# Patient Record
Sex: Male | Born: 1980 | Hispanic: Yes | Marital: Married | Smoking: Current every day smoker
Health system: Southern US, Community
[De-identification: ages and names within clinical notes are randomized; demographics above are authoritative.]

## PROBLEM LIST (undated history)

## (undated) DIAGNOSIS — Z720 Tobacco use: Secondary | ICD-10-CM

## (undated) DIAGNOSIS — R9431 Abnormal electrocardiogram [ECG] [EKG]: Secondary | ICD-10-CM

## (undated) DIAGNOSIS — K8020 Calculus of gallbladder without cholecystitis without obstruction: Secondary | ICD-10-CM

---

## 2014-08-04 ENCOUNTER — Encounter (HOSPITAL_COMMUNITY): Admission: RE | Payer: Self-pay | Source: Ambulatory Visit

## 2014-08-04 ENCOUNTER — Encounter (HOSPITAL_COMMUNITY)
Admission: EM | Disposition: A | Discharge status: Home / Self Care | Payer: Self-pay | Source: Home / Self Care | Attending: Cardiovascular Disease

## 2014-08-04 ENCOUNTER — Other Ambulatory Visit: Payer: Self-pay

## 2014-08-04 ENCOUNTER — Encounter (HOSPITAL_COMMUNITY): Payer: Self-pay | Admitting: *Deleted

## 2014-08-04 ENCOUNTER — Inpatient Hospital Stay (HOSPITAL_COMMUNITY)
Admission: EM | Admit: 2014-08-04 | Discharge: 2014-08-04 | DRG: 446 | Disposition: A | Discharge status: Home / Self Care | Payer: Self-pay | Attending: Cardiovascular Disease | Admitting: Cardiovascular Disease

## 2014-08-04 ENCOUNTER — Inpatient Hospital Stay (HOSPITAL_COMMUNITY): Payer: Self-pay

## 2014-08-04 ENCOUNTER — Emergency Department (HOSPITAL_COMMUNITY): Payer: Self-pay

## 2014-08-04 ENCOUNTER — Ambulatory Visit (HOSPITAL_COMMUNITY): Admission: AD | Admit: 2014-08-04 | Payer: MEDICAID | Admitting: Cardiovascular Disease

## 2014-08-04 DIAGNOSIS — E669 Obesity, unspecified: Secondary | ICD-10-CM | POA: Diagnosis present

## 2014-08-04 DIAGNOSIS — R079 Chest pain, unspecified: Secondary | ICD-10-CM

## 2014-08-04 DIAGNOSIS — K760 Fatty (change of) liver, not elsewhere classified: Secondary | ICD-10-CM | POA: Diagnosis present

## 2014-08-04 DIAGNOSIS — Z720 Tobacco use: Secondary | ICD-10-CM

## 2014-08-04 DIAGNOSIS — R109 Unspecified abdominal pain: Secondary | ICD-10-CM

## 2014-08-04 DIAGNOSIS — F17200 Nicotine dependence, unspecified, uncomplicated: Secondary | ICD-10-CM | POA: Diagnosis present

## 2014-08-04 DIAGNOSIS — Z6832 Body mass index (BMI) 32.0-32.9, adult: Secondary | ICD-10-CM

## 2014-08-04 DIAGNOSIS — F17210 Nicotine dependence, cigarettes, uncomplicated: Secondary | ICD-10-CM | POA: Diagnosis present

## 2014-08-04 DIAGNOSIS — I213 ST elevation (STEMI) myocardial infarction of unspecified site: Secondary | ICD-10-CM

## 2014-08-04 DIAGNOSIS — D72829 Elevated white blood cell count, unspecified: Secondary | ICD-10-CM | POA: Diagnosis present

## 2014-08-04 DIAGNOSIS — R9431 Abnormal electrocardiogram [ECG] [EKG]: Secondary | ICD-10-CM

## 2014-08-04 DIAGNOSIS — R1013 Epigastric pain: Secondary | ICD-10-CM

## 2014-08-04 DIAGNOSIS — K8020 Calculus of gallbladder without cholecystitis without obstruction: Principal | ICD-10-CM

## 2014-08-04 DIAGNOSIS — E785 Hyperlipidemia, unspecified: Secondary | ICD-10-CM | POA: Diagnosis present

## 2014-08-04 HISTORY — DX: Abnormal electrocardiogram (ECG) (EKG): R94.31

## 2014-08-04 HISTORY — PX: CARDIAC CATHETERIZATION: SHX172

## 2014-08-04 HISTORY — DX: Morbid (severe) obesity due to excess calories: E66.01

## 2014-08-04 HISTORY — PX: LEFT HEART CATHETERIZATION WITH CORONARY ANGIOGRAM: SHX5451

## 2014-08-04 HISTORY — DX: Calculus of gallbladder without cholecystitis without obstruction: K80.20

## 2014-08-04 HISTORY — DX: Tobacco use: Z72.0

## 2014-08-04 LAB — CBC WITH DIFFERENTIAL/PLATELET
Basophils Absolute: 0 10*3/uL (ref 0.0–0.1)
Basophils Absolute: 0 10*3/uL (ref 0.0–0.1)
Basophils Relative: 0 % (ref 0–1)
Basophils Relative: 0 % (ref 0–1)
EOS PCT: 1 % (ref 0–5)
EOS PCT: 0 % (ref 0–5)
Eosinophils Absolute: 0.2 10*3/uL (ref 0.0–0.7)
Eosinophils Absolute: 0 10*3/uL (ref 0.0–0.7)
HCT: 48 % (ref 39.0–52.0)
HCT: 46.3 % (ref 39.0–52.0)
Hemoglobin: 17.1 g/dL — ABNORMAL HIGH (ref 13.0–17.0)
Hemoglobin: 16.7 g/dL (ref 13.0–17.0)
LYMPHS ABS: 1.2 10*3/uL (ref 0.7–4.0)
LYMPHS PCT: 7 % — AB (ref 12–46)
Lymphocytes Relative: 23 % (ref 12–46)
Lymphs Abs: 2.8 10*3/uL (ref 0.7–4.0)
MCH: 30.6 pg (ref 26.0–34.0)
MCH: 31.2 pg (ref 26.0–34.0)
MCHC: 35.6 g/dL (ref 30.0–36.0)
MCHC: 36.1 g/dL — ABNORMAL HIGH (ref 30.0–36.0)
MCV: 86 fL (ref 78.0–100.0)
MCV: 86.4 fL (ref 78.0–100.0)
MONOS PCT: 5 % (ref 3–12)
Monocytes Absolute: 0.6 10*3/uL (ref 0.1–1.0)
Monocytes Absolute: 0.6 10*3/uL (ref 0.1–1.0)
Monocytes Relative: 4 % (ref 3–12)
NEUTROS PCT: 89 % — AB (ref 43–77)
Neutro Abs: 8.7 10*3/uL — ABNORMAL HIGH (ref 1.7–7.7)
Neutro Abs: 15.8 10*3/uL — ABNORMAL HIGH (ref 1.7–7.7)
Neutrophils Relative %: 71 % (ref 43–77)
PLATELETS: 225 10*3/uL (ref 150–400)
PLATELETS: 188 10*3/uL (ref 150–400)
RBC: 5.58 MIL/uL (ref 4.22–5.81)
RBC: 5.36 MIL/uL (ref 4.22–5.81)
RDW: 12.6 % (ref 11.5–15.5)
RDW: 12.6 % (ref 11.5–15.5)
WBC: 12.3 10*3/uL — ABNORMAL HIGH (ref 4.0–10.5)
WBC: 17.6 10*3/uL — AB (ref 4.0–10.5)

## 2014-08-04 LAB — RAPID URINE DRUG SCREEN, HOSP PERFORMED
Amphetamines: NOT DETECTED
BARBITURATES: NOT DETECTED
BENZODIAZEPINES: POSITIVE — AB
Cocaine: NOT DETECTED
Opiates: POSITIVE — AB
Tetrahydrocannabinol: NOT DETECTED

## 2014-08-04 LAB — HEPATIC FUNCTION PANEL
ALK PHOS: 81 U/L (ref 38–126)
ALT: 34 U/L (ref 17–63)
AST: 21 U/L (ref 15–41)
Albumin: 4.3 g/dL (ref 3.5–5.0)
BILIRUBIN DIRECT: 0.1 mg/dL (ref 0.1–0.5)
BILIRUBIN INDIRECT: 0.4 mg/dL (ref 0.3–0.9)
Total Bilirubin: 0.5 mg/dL (ref 0.3–1.2)
Total Protein: 7.2 g/dL (ref 6.5–8.1)

## 2014-08-04 LAB — COMPREHENSIVE METABOLIC PANEL
ALBUMIN: 4.1 g/dL (ref 3.5–5.0)
ALT: 31 U/L (ref 17–63)
ANION GAP: 12 (ref 5–15)
AST: 24 U/L (ref 15–41)
Alkaline Phosphatase: 71 U/L (ref 38–126)
BILIRUBIN TOTAL: 0.7 mg/dL (ref 0.3–1.2)
BUN: 13 mg/dL (ref 6–20)
CALCIUM: 9 mg/dL (ref 8.9–10.3)
CO2: 23 mmol/L (ref 22–32)
CREATININE: 0.79 mg/dL (ref 0.61–1.24)
Chloride: 104 mmol/L (ref 101–111)
GFR calc Af Amer: >60 mL/min (ref >60)
GFR calc non Af Amer: >60 mL/min (ref >60)
GLUCOSE: 120 mg/dL — AB (ref 70–99)
Potassium: 3.5 mmol/L (ref 3.5–5.1)
Sodium: 139 mmol/L (ref 135–145)
Total Protein: 7 g/dL (ref 6.5–8.1)

## 2014-08-04 LAB — MRSA PCR SCREENING: MRSA by PCR: POSITIVE — AB

## 2014-08-04 LAB — LIPID PANEL
CHOLESTEROL: 201 mg/dL — AB (ref 0–200)
HDL: 40 mg/dL — ABNORMAL LOW (ref >40)
LDL Cholesterol: 147 mg/dL — ABNORMAL HIGH (ref 0–99)
TRIGLYCERIDES: 69 mg/dL (ref <150)
Total CHOL/HDL Ratio: 5 RATIO
VLDL: 14 mg/dL (ref 0–40)

## 2014-08-04 LAB — LIPASE, BLOOD: Lipase: 40 U/L (ref 22–51)

## 2014-08-04 LAB — TSH: TSH: 0.929 u[IU]/mL (ref 0.350–4.500)

## 2014-08-04 LAB — MAGNESIUM: MAGNESIUM: 1.9 mg/dL (ref 1.7–2.4)

## 2014-08-04 LAB — I-STAT TROPONIN, ED: Troponin i, poc: 0 ng/mL (ref 0.00–0.08)

## 2014-08-04 LAB — PROTIME-INR
INR: 0.96 (ref 0.00–1.49)
PROTHROMBIN TIME: 12.9 s (ref 11.6–15.2)

## 2014-08-04 LAB — TROPONIN I
Troponin I: <0.03 ng/mL (ref <0.031)
Troponin I: <0.03 ng/mL (ref <0.031)

## 2014-08-04 LAB — D-DIMER, QUANTITATIVE: D-Dimer, Quant: <0.27 ug/mL-FEU (ref 0.00–0.48)

## 2014-08-04 LAB — AMYLASE: Amylase: 40 U/L (ref 28–100)

## 2014-08-04 LAB — APTT: APTT: 25 s (ref 24–37)

## 2014-08-04 SURGERY — LEFT HEART CATH AND CORONARY ANGIOGRAPHY
Anesthesia: LOCAL

## 2014-08-04 SURGERY — LEFT HEART CATHETERIZATION WITH CORONARY ANGIOGRAM
Anesthesia: LOCAL

## 2014-08-04 MED ORDER — HEPARIN SODIUM (PORCINE) 5000 UNIT/ML IJ SOLN
4000.0000 [IU] | Freq: Once | INTRAMUSCULAR | Status: AC
  Administered 2014-08-04: 4000 [IU] via INTRAVENOUS

## 2014-08-04 MED ORDER — FENTANYL CITRATE (PF) 100 MCG/2ML IJ SOLN
INTRAMUSCULAR | Status: AC
  Filled 2014-08-04: qty 2

## 2014-08-04 MED ORDER — SODIUM CHLORIDE 0.9 % IV SOLN: INTRAVENOUS | Status: AC

## 2014-08-04 MED ORDER — HEPARIN SODIUM (PORCINE) 5000 UNIT/ML IJ SOLN: 5000.0000 [IU] | Freq: Once | INTRAMUSCULAR | Status: DC

## 2014-08-04 MED ORDER — MORPHINE SULFATE 4 MG/ML IJ SOLN
INTRAMUSCULAR | Status: AC
Start: 2014-08-04 — End: 2014-08-04
  Filled 2014-08-04: qty 1

## 2014-08-04 MED ORDER — MUPIROCIN 2 % EX OINT
1.0000 "application " | TOPICAL_OINTMENT | Freq: Two times a day (BID) | CUTANEOUS | Status: DC
  Administered 2014-08-04: 1 via NASAL
  Filled 2014-08-04: qty 22

## 2014-08-04 MED ORDER — ASPIRIN 81 MG PO CHEW
324.0000 mg | CHEWABLE_TABLET | Freq: Once | ORAL | Status: AC
  Administered 2014-08-04: 324 mg via ORAL

## 2014-08-04 MED ORDER — HEPARIN SODIUM (PORCINE) 1000 UNIT/ML IJ SOLN
INTRAMUSCULAR | Status: AC
  Filled 2014-08-04: qty 1

## 2014-08-04 MED ORDER — LIDOCAINE HCL (PF) 1 % IJ SOLN
INTRAMUSCULAR | Status: AC
  Filled 2014-08-04: qty 30

## 2014-08-04 MED ORDER — ACETAMINOPHEN 325 MG PO TABS: 650.0000 mg | ORAL_TABLET | ORAL | Status: DC | PRN

## 2014-08-04 MED ORDER — HEPARIN (PORCINE) IN NACL 2-0.9 UNIT/ML-% IJ SOLN
INTRAMUSCULAR | Status: AC
  Filled 2014-08-04: qty 1000

## 2014-08-04 MED ORDER — MORPHINE SULFATE 4 MG/ML IJ SOLN
4.0000 mg | Freq: Once | INTRAMUSCULAR | Status: AC
  Administered 2014-08-04: 4 mg via INTRAVENOUS

## 2014-08-04 MED ORDER — ACETAMINOPHEN ER 650 MG PO TBCR: 650.0000 mg | EXTENDED_RELEASE_TABLET | Freq: Three times a day (TID) | ORAL | Status: AC | PRN

## 2014-08-04 MED ORDER — ASPIRIN 81 MG PO CHEW: 324.0000 mg | CHEWABLE_TABLET | ORAL | Status: DC

## 2014-08-04 MED ORDER — SODIUM CHLORIDE 0.9 % IJ SOLN: 3.0000 mL | INTRAMUSCULAR | Status: DC | PRN

## 2014-08-04 MED ORDER — MIDAZOLAM HCL 2 MG/2ML IJ SOLN
INTRAMUSCULAR | Status: AC
  Filled 2014-08-04: qty 2

## 2014-08-04 MED ORDER — ONDANSETRON HCL 4 MG/2ML IJ SOLN: 4.0000 mg | Freq: Four times a day (QID) | INTRAMUSCULAR | Status: DC | PRN

## 2014-08-04 MED ORDER — VERAPAMIL HCL 2.5 MG/ML IV SOLN
INTRAVENOUS | Status: AC
  Filled 2014-08-04: qty 2

## 2014-08-04 MED ORDER — CHLORHEXIDINE GLUCONATE CLOTH 2 % EX PADS
6.0000 | MEDICATED_PAD | Freq: Every day | CUTANEOUS | Status: DC
  Administered 2014-08-04: 6 via TOPICAL

## 2014-08-04 MED ORDER — ASPIRIN 300 MG RE SUPP: 300.0000 mg | RECTAL | Status: DC

## 2014-08-04 MED ORDER — SODIUM CHLORIDE 0.9 % IV SOLN: 250.0000 mL | INTRAVENOUS | Status: DC | PRN

## 2014-08-04 MED ORDER — HEPARIN SODIUM (PORCINE) 5000 UNIT/ML IJ SOLN
INTRAMUSCULAR | Status: AC
  Filled 2014-08-04: qty 1

## 2014-08-04 MED ORDER — CETYLPYRIDINIUM CHLORIDE 0.05 % MT LIQD
7.0000 mL | Freq: Two times a day (BID) | OROMUCOSAL | Status: DC
  Administered 2014-08-04: 7 mL via OROMUCOSAL

## 2014-08-04 MED ORDER — ONDANSETRON HCL 4 MG/2ML IJ SOLN
4.0000 mg | Freq: Four times a day (QID) | INTRAMUSCULAR | Status: DC | PRN
Start: 2014-08-04 — End: 2014-08-04

## 2014-08-04 MED ORDER — ASPIRIN 81 MG PO CHEW
81.0000 mg | CHEWABLE_TABLET | Freq: Once | ORAL | Status: DC
  Filled 2014-08-04: qty 1

## 2014-08-04 MED ORDER — NITROGLYCERIN 1 MG/10 ML FOR IR/CATH LAB
INTRA_ARTERIAL | Status: AC
  Filled 2014-08-04: qty 10

## 2014-08-04 MED ORDER — MORPHINE SULFATE 2 MG/ML IJ SOLN
2.0000 mg | INTRAMUSCULAR | Status: DC | PRN
  Administered 2014-08-04: 2 mg via INTRAVENOUS
  Filled 2014-08-04: qty 1

## 2014-08-04 MED ORDER — NITROGLYCERIN 0.4 MG SL SUBL: 0.4000 mg | SUBLINGUAL_TABLET | SUBLINGUAL | Status: DC | PRN

## 2014-08-04 MED ORDER — SODIUM CHLORIDE 0.9 % IJ SOLN
3.0000 mL | Freq: Two times a day (BID) | INTRAMUSCULAR | Status: DC
  Administered 2014-08-04: 3 mL via INTRAVENOUS

## 2014-08-04 SURGICAL SUPPLY — 16 items
CATH HEARTRAIL 6F IL3.5 (CATHETERS) ×2 IMPLANT
CATH INFINITI 5FR ANG PIGTAIL (CATHETERS) ×2 IMPLANT
CATH INFINITI 5FR MULTPACK ANG (CATHETERS) IMPLANT
CATH INFINITI JR4 5F (CATHETERS) ×2 IMPLANT
CATH OPTITORQUE JACKY 4.0 5F (CATHETERS) IMPLANT
DEVICE RAD COMP TR BAND LRG (VASCULAR PRODUCTS) ×2 IMPLANT
GLIDESHEATH SLEND SS 6F .021 (SHEATH) ×2 IMPLANT
KIT ENCORE 26 ADVANTAGE (KITS) ×2 IMPLANT
KIT HEART LEFT (KITS) ×2 IMPLANT
PACK CARDIAC CATHETERIZATION (CUSTOM PROCEDURE TRAY) ×2 IMPLANT
SHEATH PINNACLE 5F 10CM (SHEATH) IMPLANT
SYR MEDRAD MARK V 150ML (SYRINGE) ×2 IMPLANT
TRANSDUCER W/STOPCOCK (MISCELLANEOUS) ×2 IMPLANT
TUBING CIL FLEX 10 FLL-RA (TUBING) ×2 IMPLANT
WIRE EMERALD 3MM-J .035X150CM (WIRE) IMPLANT
WIRE SAFE-T 1.5MM-J .035X260CM (WIRE) ×2 IMPLANT

## 2014-08-04 NOTE — ED Notes (Signed)
The pt is c/o epigastric pain since 2300 no nor v.  He last ate at 1900.  He has a history of the same type pain.  He  Is having  Difficulty sitting still from the pain.  Hx of th same type pain

## 2014-08-04 NOTE — Progress Notes (Signed)
[facility] progress note. Chief complaint. Consult request. I received a call from Dr. Carolanne Grumblingracy Turner cardiology requesting a medical consult on this 34 year old gentleman who presented with complaints of chest pain. EKG suggested acute inferior STEMI and the patient was taken to cardiac cath. Final conclusion of cardiac cath normal coronary arteries, normal left ventricular systolic function and no evidence of aortic dissection. Cardiology's impression is of unrelieved epigastric pain most likely secondary to GI origin and not cardiac in origin. Consult requested to further evaluate and treat the source of pain.

## 2014-08-04 NOTE — ED Provider Notes (Signed)
CSN: [id number]     Arrival date & time 08/04/14  0236 History  This chart was scribed for Marisa Severin, MD by Abel Presto, ED Scribe. This patient was seen in room D34C/D34C and the patient's care was started at 2:48 AM.    Chief Complaint  Patient presents with  . Abdominal Pain     Patient is a 34 y.o. male presenting with abdominal pain. The history is provided by the patient. No language interpreter was used.  Abdominal Pain Associated symptoms: no nausea and no vomiting    HPI Comments: John Vasquez is a 34 y.o. male who presents to the Emergency Department complaining of epigastric pain with onset at 11 PM.  Noted diaphoresis. Pt reports similar pain 2 weeks ago lasting 2 hours. Pty notes pain radiates to lower back pain. Pt took Tylenol at 2 AM. Pt last ate around 7 PM. Pt denies significant PMHx or FHx. Pt denies nausea and vomiting.   History reviewed. No pertinent past medical history. History reviewed. No pertinent past surgical history. No family history on file. History  Substance Use Topics  . Smoking status: Never Smoker   . Smokeless tobacco: Not on file  . Alcohol Use: Yes    Review of Systems  Constitutional: Positive for diaphoresis.  Gastrointestinal: Positive for abdominal pain. Negative for nausea and vomiting.  Musculoskeletal: Positive for back pain.     Allergies  Review of patient's allergies indicates no known allergies.  Home Medications   Prior to Admission medications   Not on File   BP 119/74 mmHg  Pulse 71  Temp(Src) 98.1 F (36.7 C) (Oral)  Resp 28  Ht  (1.676 m)  Wt 197 lb (89.359 kg)  BMI 31.81 kg/m2  SpO2 100% Physical Exam  Constitutional: He is oriented to person, place, and time. He appears well-developed and well-nourished. He appears distressed.  HENT:  Head: Normocephalic and atraumatic.  Nose: Nose normal.  Mouth/Throat: Oropharynx is clear and moist.  Eyes: Conjunctivae and EOM are normal. Pupils are equal,  round, and reactive to light.  Neck: Normal range of motion. Neck supple. No JVD present. No tracheal deviation present. No thyromegaly present.  Cardiovascular: Normal rate, regular rhythm, normal heart sounds and intact distal pulses.  Exam reveals no gallop and no friction rub.   No murmur heard. Pulmonary/Chest: Effort normal and breath sounds normal. No stridor. No respiratory distress. He has no wheezes. He has no rales. He exhibits no tenderness.  Abdominal: Soft. Bowel sounds are normal. He exhibits no distension and no mass. There is no tenderness. There is no rebound and no guarding.  Musculoskeletal: Normal range of motion. He exhibits no edema or tenderness.  Lymphadenopathy:    He has no cervical adenopathy.  Neurological: He is alert and oriented to person, place, and time. He displays normal reflexes. He exhibits normal muscle tone. Coordination normal.  Skin: Skin is warm. No rash noted. He is diaphoretic. No erythema. No pallor.  Psychiatric: He has a normal mood and affect. His behavior is normal. Judgment and thought content normal.  Nursing note and vitals reviewed.   ED Course  Procedures (including critical care time) DIAGNOSTIC STUDIES: Oxygen Saturation is 99% on room air, normal by my interpretation.    COORDINATION OF CARE: 3:05 AM Discussed treatment plan with patient at beside, the patient agrees with the plan and has no further questions at this time.   Labs Review Labs Reviewed  CBC WITH DIFFERENTIAL/PLATELET - Abnormal; Notable for  the following:    WBC 12.3 (*)    Hemoglobin 17.1 (*)    Neutro Abs 8.7 (*)    All other components within normal limits  MRSA PCR SCREENING  LIPASE, BLOOD  HEPATIC FUNCTION PANEL  D-DIMER, QUANTITATIVE  AMYLASE  COMPREHENSIVE METABOLIC PANEL  MAGNESIUM  TSH  TROPONIN I  TROPONIN I  TROPONIN I  HEMOGLOBIN A1C  CBC WITH DIFFERENTIAL/PLATELET  PROTIME-INR  APTT  OCCULT BLOOD X 1 CARD TO LAB, STOOL  LIPID PANEL   I-STAT TROPOININ, ED  I-STAT TROPOININ, ED  I-STAT TROPOININ, ED    Imaging Review No results found.   Meds ordered this encounter  Medications  . DISCONTD: aspirin chewable tablet 81 mg    Sig:   . DISCONTD: heparin injection 5,000 Units    Sig:   . heparin injection 4,000 Units    Sig:   . morphine 4 MG/ML injection 4 mg    Sig:   . heparin 5000 UNIT/ML injection    Sig:     Dereck LigasSnider, Steven   : cabinet override  . aspirin chewable tablet 324 mg    Sig:   . morphine 4 MG/ML injection    Sig:     Dereck LigasSnider, Steven   : cabinet override    CRITICAL CARE Performed by: Marisa Severinlga Read Bonelli, MD Total critical care time: 30 min Critical care time was exclusive of separately billable procedures and treating other patients. Critical care was necessary to treat or prevent imminent or life-threatening deterioration. Critical care was time spent personally by me on the following activities: development of treatment plan with patient and/or surrogate as well as nursing, discussions with consultants, evaluation of patient's response to treatment, examination of patient, obtaining history from patient or surrogate, ordering and performing treatments and interventions, ordering and review of laboratory studies, ordering and review of radiographic studies, pulse oximetry and re-evaluation of patient's condition.   2:48 AM Pt transported to ED room from triage 2:49 AM Dr. Norlene Campbelltter consulted with Dr. Kirke CorinArida. Patient case explained and discussed. Call ended at 2:51 PM.  2:59 AM Chest XR complete 3:03 AM heparin injection, morphine injection, and 324 mg ASA chewable administered 3:07 AM Dr. Mayford Knifeurner, cardiologist in to see the pt   MDM   Final diagnoses:  Abdominal pain    I personally performed the services described in this documentation, which was scribed in my presence. The recorded information has been reviewed and is accurate.  34 year old male without previous medical issues.  EKG with elevation  in ST segment in 2, 3, aVF.  There are no reciprocal changes.  Case was discussed with interventional cardiology, as well as Dr. Mayford Knifeurner at the bedside.  No risk factors, but concerning EKG.  Pain is epigastric and radiates backwards.   Marisa Severinlga Elyzabeth Goatley, MD 08/04/14 [phone]

## 2014-08-04 NOTE — ED Notes (Signed)
Pt placed on zoll.

## 2014-08-04 NOTE — Discharge Instructions (Signed)
Colelitiasis (Cholelithiasis) La colelitiasis (tambin llamada clculos en la vescula) es una enfermedad en la que se forman piedras en la vescula. La vescula es un rgano que almacena la bilis que se forma en el hgado y que ayuda a Engineer, agriculturaldigerir grasas. Los clculos comienzan como pequeos cristales y lentamente se transforman en piedras. El dolor en la vescula ocurre cuando se producen espasmos y los clculos obstruyen el conducto. El dolor tambin se produce cuando una piedra sale por el conducto.  FACTORES DE RIESGO  Ser mujer.   Tener embarazos mltiples. Algunas veces los mdicos aconsejan extirpar los clculos biliares antes de futuros embarazos.   Ser obeso.  Dietas que incluyan comidas fritas y grasas.   Ser mayor de 760 aos y el aumento de la edad.   El uso prolongado de medicamentos que contengan hormonas femeninas.   Tener diabetes mellitus.   Prdida rpida de peso.   Historia familiar de clculos (herencia).  SNTOMAS  Nuseas.   Vmitos.  Dolor abdominal.   Piel amarilla (ictericia)   Dolor sbito. Puede persistir desde algunos minutos hasta algunas horas.  Grant RutsFiebre.   Sensibilidad al tacto. En algunos casos, cuando los clculos biliares no se mueven hacia el conducto biliar, las personas no sienten dolor ni presentan sntomas. Estos se denominan clculos "silenciosos".  TRATAMIENTO Los clculos silenciosos no requieren TEFL teachertratamiento. En los [place] Tool Workscasos graves, podr ser Bangladeshnecesaria una ciruga de urgencia. Las opciones de tratamiento son:  Kandis BanCiruga para extirpar la vescula. Es el tratamiento ms frecuente.  Medicamentos. No siempre dan resultado y pueden demorar entre 6 y 12 meses o ms en Scientist, water qualityhacer efecto.  Tratamiento con ondas de choque (litotricia biliar extracorporal). En este tratamiento, una mquina de ultrasonido enva ondas de choque a la vescula para destruir los clculos en pequeos fragmentos que luego podrn pasar a los intestinos o ser disueltas  con medicamentos. INSTRUCCIONES PARA EL CUIDADO EN EL HOGAR   Slo tome medicamentos de venta libre o recetados para Primary school teachercalmar el dolor, Environmental health practitionerel malestar o bajar la fiebre, segn las indicaciones de su mdico.   Siga una dieta baja en grasas hasta que su mdico lo vea nuevamente. Las grasas hacen que la vescula se Technical sales engineercontraiga, lo que puede Engineer, agriculturalproducir dolor.   Concurra a las consultas de control con su mdico segn las indicaciones. Los ataques casi siempre son recurrentes y generalmente habr que someterse a una ciruga como [place] Citytratamiento permanente.  SOLICITE ATENCIN MDICA DE INMEDIATO SI:   El dolor aumenta y no puede controlarlo con los medicamentos.   Tiene fiebre o sntomas persistentes durante ms de 2 - 3 das.   Tiene fiebre y los sntomas empeoran repentinamente.   Tiene nuseas o vmitos persistentes.  ASEGRESE DE QUE:   Comprende estas instrucciones.  Controlar su afeccin.  Recibir ayuda de inmediato si no mejora o si empeora. Document Released: 01/06/2006 Document Revised: 11/22/2012 [facility] Patient Information 2015 [place]Care, [place]. This information is not intended to replace advice given to you by your health care provider. Make sure you discuss any questions you have with your health care provider. _____________   Radial Site Care Refer to this sheet in the next few weeks. These instructions provide you with information on caring for yourself after your procedure. Your caregiver may also give you more specific instructions. Your treatment has been planned according to current medical practices, but problems sometimes occur. Call your caregiver if you have any problems or questions after your procedure. HOME CARE INSTRUCTIONS  You may shower the day after  the procedure.Remove the bandage (dressing) and gently wash the site with plain soap and water.Gently pat the site dry.   Do not apply powder or lotion to the site.   Do not submerge the affected site in water for  3 to 5 days.   Inspect the site at least twice daily.   Do not flex or bend the affected arm for 24 hours.   No lifting over 5 pounds (2.3 kg) for 5 days after your procedure.   Do not drive home if you are discharged the same day of the procedure. Have someone else drive you.   You may drive 24 hours after the procedure unless otherwise instructed by your caregiver.  What to expect:  Any bruising will usually fade within 1 to 2 weeks.   Blood that collects in the tissue (hematoma) may be painful to the touch. It should usually decrease in size and tenderness within 1 to 2 weeks.  SEEK IMMEDIATE MEDICAL CARE IF:  You have unusual pain at the radial site.   You have redness, warmth, swelling, or pain at the radial site.   You have drainage (other than a small amount of blood on the dressing).   You have chills.   You have a fever or persistent symptoms for more than 72 hours.   You have a fever and your symptoms suddenly get worse.   Your arm becomes pale, cool, tingly, or numb.   You have heavy bleeding from the site. Hold pressure on the site.

## 2014-08-04 NOTE — ED Notes (Signed)
Pt ready to cath lab. Dr. Mayford Knifeurner at bedside.

## 2014-08-04 NOTE — CV Procedure (Signed)
   Cardiac Catheterization Procedure Note  Name: John Vasquez MRN: [id number] DOB: 1980-06-22  Procedure: Left Heart Cath, Selective Coronary Angiography, LV angiography, ascending aortogram  Indication: Acute chest and abdominal pain with EKG changes suggestive of inferior ST elevation MI  Medications:  Sedation:  1 mg IV Versed, 75 mcg IV Fentanyl  Contrast:  95 mL Omnipaque   Procedural Details: The right wrist was prepped, draped, and anesthetized with 1% lidocaine. Using the modified Seldinger technique, a 5 French sheath was introduced into the right radial artery. 3 mg of verapamil was administered through the sheath, weight-based unfractionated heparin was administered intravenously. An IKARI left 3.5 catheter was used for selective coronary angiography. A JR4 diagnostic catheter was used for right coronary artery angiography. A pigtail catheter was used for left ventriculography. Catheter exchanges were performed over an exchange length guidewire. There were no immediate procedural complications. A TR band was used for radial hemostasis at the completion of the procedure.  The patient was transferred to the post catheterization recovery area for further monitoring.  Procedural Findings:  Hemodynamics: AO:  106/74   mmHg LV:  108 over 4    mmHg LVEDP: 9  mmHg  Coronary angiography: Coronary dominance: Right   Left Main:  Normal  Left Anterior Descending (LAD):  Normal in size with no significant disease.  1st diagonal (D1):  Normal  2nd diagonal (D2):  Normal  3rd diagonal (D3):  Very small in size.  Circumflex (LCx):  Normal in size and nondominant. The vessel has no significant disease.  1st obtuse marginal:  Very small in size.  2nd obtuse marginal:  Normal in size with no significant disease.  3rd obtuse marginal:  Small in size with no significant disease.    Right Coronary Artery: Large in size and dominant. The vessel has no significant  disease.  Posterior descending artery: Normal in size with no significant disease.  Posterior AV segment: Normal  Posterolateral branchs:  Normal  Left ventriculography: Left ventricular systolic function is normal , LVEF is estimated at 60 %, there is no significant mitral regurgitation .   Ascending aortogram: Normal size aorta with no evidence of aneurysm or dissection. The aorta was imaged to the proximal abdominal parts.  Final Conclusions:   1. Normal coronary arteries. 2. Normal LV systolic function and left ventricular end-diastolic pressure. 3. No evidence of aortic dissection.  Recommendations:  The chest pain is noncardiac. The etiology of his symptoms are still not clear. He complained of shortness of breath during cardiac cath his oxygen saturation was normal. I ordered stat d-dimer. Given his abdominal pain radiating to his back, I requested a stat abdominal ultrasound.  Lorine BearsMuhammad Arida MD, Citizens Medical CenterFACC 08/04/2014, 4:14 AM

## 2014-08-04 NOTE — Consult Note (Addendum)
[facility] Medical Consultation  John Vasquez EAV:[id number] DOB: April 27, 1980 DOA: 08/04/2014 PCP: No primary care provider on file.   Requesting physician: Carolanne Grumbling, cardiology Date of consultation: 08/04/14 Reason for consultation: Abdominal pain  Impression/Recommendations Active Problems:   Abdominal pain: History seems very classic for pancreatitis. Patient says he drinks socially and not heavily. Given recent cardiac cath with the diet, would not get CT of abdomen and pelvis with contrast. Abdominal ultrasound is ordered and pending. We'll review this and if unremarkable, patient should be allowed to eat. If all is stable, can likely discharge home later today. Will need outpatient follow-up even obesity, hyperlipidemia and if the symptoms recur, outpatient GI referral for endoscopy. We'll have case manager setup [facility] referral   Leukocytosis: Unclear etiology. Stress margination? We'll recheck labs early afternoon   Hyperlipidemia: Patient would benefit from statin   Obesity (BMI 30-39.9): Patient meets criteria with BMI greater than 30   Tobacco use disorder: Says he smokes about 2-3 cigarettes a day and not every day. He declined nicotine patch. Counseled.    Chief Complaint: Abdominal pain  HPI:  34 year old male with past history of obesity and tobacco abuse who has had a few intermittent episodes of midepigastric pain, usually very mild in what he attributed to heartburn and then started having severe 10/10 pain last night, described as sharp and radiating to his back which would not resolve so he came into the emergency room for further evaluation. Lab work noted for white count of 12, and yet lipase level normal. The emergency room lab work on normal, however EKG concerning for ST elevations in the inferior leads for possible MI. Cardiology notified and patient taken for emergent cardiac catheterization. Patient's cardiac vessels all normal and patient's  source of pain felt to be noncardiac related. Follow-up labs noted white count elevation to 17. Hospitalist call for further evaluation  Review of Systems:  Patient seen post cath. Doing okay. Tired. Denies any headaches, vision changes, dysphagia, chest pain, palpitations, shortness of breath, wheeze, cough, abdominal pain, hematuria, dysuria, constipation, diarrhea, focal extremity numbness weakness or pain. review of systems otherwise negative  Past Medical history  tobacco abuse Obesity   History reviewed. No pertinent past surgical history. Social History:  reports that he has never smoked. He does not have any smokeless tobacco history on file. He reports that he drinks alcohol. His drug history is not on file.  No Known Allergies Family history: Patient confirms that he has no medical problems that run in his family as far as he knows  Prior to Admission medications   Not on File   Physical Exam: Blood pressure 119/64, pulse 60, temperature 98.7 F (37.1 C), temperature source Oral, resp. rate 13, height  (1.676 m), weight 89.9 kg (198 lb 3.1 oz), SpO2 99 %. Filed Vitals:   08/04/14 0800  BP: 119/64  Pulse: 60  Temp:   Resp: 13     General:  Alert and oriented 3, no acute distress  Eyes: Sclera nonicteric, asked ocular movements are intact  ENT: Normocephalic, atraumatic, mucous membranes are slightly dry  Neck: No JVD  Cardiovascular: Regular rate and rhythm, S1-S2  Respiratory: Clear to auscultation bilaterally  Abdomen: Soft, nontender, nondistended, positive bowel sounds  Skin: No skin breaks, tears or lesions  Musculoskeletal: No clubbing or cyanosis or edema  Psychiatric: Patient is appropriate, no evidence of psychoses  Neurologic: No focal deficits  Labs on Admission:  Basic Metabolic Panel:  Recent Labs  Lab 08/04/14 0505  NA 139  K 3.5  CL 104  CO2 23  GLUCOSE 120*  BUN 13  CREATININE 0.79  CALCIUM 9.0  MG 1.9   Liver Function  Tests:  Recent Labs Lab 08/04/14 0252 08/04/14 0505  AST 21 24  ALT 34 31  ALKPHOS 81 71  BILITOT 0.5 0.7  PROT 7.2 7.0  ALBUMIN 4.3 4.1    Recent Labs Lab 08/04/14 0252 08/04/14 0505  LIPASE 40  --   AMYLASE  --  40   No results for input(s): AMMONIA in the last 168 hours. CBC:  Recent Labs Lab 08/04/14 0252 08/04/14 0505  WBC 12.3* 17.6*  NEUTROABS 8.7* 15.8*  HGB 17.1* 16.7  HCT 48.0 46.3  MCV 86.0 86.4  PLT 225 188   Cardiac Enzymes:  Recent Labs Lab 08/04/14 0505  TROPONINI <0.03   BNP: Invalid input(s): POCBNP CBG: No results for input(s): GLUCAP in the last 168 hours.  Radiological Exams on Admission: Dg Chest Portable 1 View  08/04/2014   CLINICAL DATA:  Epigastric pain beginning at 11 p.m., diaphoresis. Similar symptoms lasting 2 hours, 2 weeks ago.  EXAM: PORTABLE CHEST - 1 VIEW  COMPARISON:  None.  FINDINGS: The heart size and mediastinal contours are within normal limits. Both lungs are clear. The visualized skeletal structures are unremarkable.  IMPRESSION: Normal chest.   Electronically Signed   By: Awilda Metroourtnay  Bloomer   On: 08/04/2014 04:15    EKG: Independently reviewed. ST elevations, most notably in the inferior lead pattern  Time spent: 45 minutes  Hollice EspyKRISHNAN,Kiyra Slaubaugh K [facility] Pager [phone]  If 7PM-7AM, please contact night-coverage [email or web address] Password [email or web address] 08/04/2014, 8:55 AM

## 2014-08-04 NOTE — Discharge Summary (Signed)
Discharge Summary   Patient ID: John Vasquez,  MRN: [id number], DOB/AGE: Aug 19, 1980 34 y.o.  Admit date: 08/04/2014 Discharge date: 08/04/2014  Primary Care Provider: No primary care provider on file. Primary Cardiologist: T. Turner, MD   Discharge Diagnoses Principal Problem:   Abdominal pain Active Problems:   Cholelithiasis   Abnormal ECG   Obesity (BMI 30-39.9)   Tobacco use disorder   Leukocytosis   Hyperlipidemia  Allergies No Known Allergies  Procedures  Cardiac Catheterization 4.30.2016  Hemodynamics: AO:  106/74   mmHg LV:  108 over 4    mmHg LVEDP: 9  mmHg  Coronary angiography: Coronary dominance: Right     Left Main:  Normal  Left Anterior Descending (LAD):  Normal in size with no significant disease.  1st diagonal (D1):  Normal  2nd diagonal (D2):  Normal  3rd diagonal (D3):  Very small in size.  Circumflex (LCx):  Normal in size and nondominant. The vessel has no significant disease.  1st obtuse marginal:  Very small in size.  2nd obtuse marginal:  Normal in size with no significant disease.  3rd obtuse marginal:  Small in size with no significant disease.             Right Coronary Artery: Large in size and dominant. The vessel has no significant disease.  Posterior descending artery: Normal in size with no significant disease.  Posterior AV segment: Normal  Posterolateral branchs:  Normal  Left ventriculography: Left ventricular systolic function is normal , LVEF is estimated at 60 %, there is no significant mitral regurgitation .  _____________   Abdominal Ultrasound 5.1.2016  IMPRESSION:  Cholelithiasis. There is a gallstone which remains in the neck of the gallbladder.   Diffuse increased liver echogenicity, a finding most likely due to hepatic steatosis. While no focal liver lesions are identified, it must be cautioned that the sensitivity of ultrasound for focal liver lesions is diminished in this circumstance.   Much  of the pancreas is obscured by gas.   Study otherwise unremarkable. _____________   History of Present Illness  34 y/o male without prior cardiac history.  He was in his usoh until the evening of 4/30, when he awoke with epigastric pain and diaphoresis.  He presented to the [facility] ED where ECG showed inferior ST elevation.  A Code STEMI was activated and patient was taken emergently to the catheterization laboratory.  Hospital Course  Emergent catheterization revealed normal coronary arteries and normal LV function.  Given ongoing epigastric pain, internal medicine was consulted and an abdominal ultrasound was performed.  This showed cholelithiasis and probable hepatic steatosis.  There was no evidence of pancreatitis and LFT's, amylase, and lipase remained within normal limits.  D dimer is also normal.  Patient has been cleared for discharge with recommendation for primary care and GI follow-up within the next few weeks.  He has also been counseled on the importance of lifestyle modification, weight loss, and tobacco cessation.  Discharge Vitals Blood pressure 115/63, pulse 63, temperature 98.2 F (36.8 C), temperature source Oral, resp. rate 14, height  (1.676 m), weight 198 lb 3.1 oz (89.9 kg), SpO2 96 %.  Filed Weights   08/04/14 0238 08/04/14 0426  Weight: 197 lb (89.359 kg) 198 lb 3.1 oz (89.9 kg)    Labs  CBC  Recent Labs  08/04/14 0252 08/04/14 0505  WBC 12.3* 17.6*  NEUTROABS 8.7* 15.8*  HGB 17.1* 16.7  HCT 48.0 46.3  MCV 86.0 86.4  PLT 225 188  Basic Metabolic Panel  Recent Labs  08/04/14 0505  NA 139  K 3.5  CL 104  CO2 23  GLUCOSE 120*  BUN 13  CREATININE 0.79  CALCIUM 9.0  MG 1.9   Liver Function Tests  Recent Labs  08/04/14 0252 08/04/14 0505  AST 21 24  ALT 34 31  ALKPHOS 81 71  BILITOT 0.5 0.7  PROT 7.2 7.0  ALBUMIN 4.3 4.1    Recent Labs  08/04/14 0252 08/04/14 0505  LIPASE 40  --   AMYLASE  --  40   Cardiac  Enzymes  Recent Labs  08/04/14 0505 08/04/14 1015  TROPONINI <0.03 <0.03   D-Dimer  Recent Labs  08/04/14 0505  DDIMER <0.27   Fasting Lipid Panel  Recent Labs  08/04/14 0505  CHOL 201*  HDL 40*  LDLCALC 147*  TRIG 69  CHOLHDL 5.0   Thyroid Function Tests  Recent Labs  08/04/14 0505  TSH 0.929    Disposition  Pt is being discharged home today in good condition.  Follow-up Plans & Appointments      Follow-up Information    Please follow up.   Why:  [facility] give [facility]      Discharge Medications    Medication List    TAKE these medications        acetaminophen 650 MG CR tablet  Commonly known as:  TYLENOL  Take 1 tablet (650 mg total) by mouth every 8 (eight) hours as needed for pain. Do not take more than 4G in one day.        Outstanding Labs/Studies  None.  Duration of Discharge Encounter   Greater than 30 minutes including physician time.  Signed, Nicolasa Duckinghristopher Deanna Wiater NP 08/04/2014, 5:07 PM

## 2014-08-04 NOTE — Progress Notes (Signed)
*  PRELIMINARY RESULTS* Echocardiogram 2D Echocardiogram has been performed.  Jeryl Columbialliott, Nikola Marone 08/04/2014, 4:12 PM

## 2014-08-04 NOTE — ED Notes (Signed)
Pt took 650mg  tylenol at approx 2am

## 2014-08-04 NOTE — H&P (Signed)
Admit date: 08/04/2014 Referring Physician:  Dr. Norlene Campbell Primary Cardiologist: None Chief complaint/reason for admission:STEMI  HPI: This is a very pleasant Hispanic male with no prior cardiac or PMH except for tobacco use who presented to the ER with complaints of epigastric pain.  He was falling asleep last night around 11pm when he started having epigastric pain.  He became diaphoretic but no nausea or SOB.  The pain radiated around his back but not into his arms.  He had similar pain 2 weeks ago.  He took 2 tylenol at 2am and decided to come to ER.  He currently complains of 9/10 pain.  He also says it hurts when he drinks cold water.  There is no family history of CAD.  He is a smoker.    PMH:   History reviewed. No pertinent past medical history.  PSH:   History reviewed. No pertinent past surgical history.  ALLERGIES:   Review of patient's allergies indicates no known allergies.  Prior to Admit Meds:   (Not in a hospital admission) Family HX:   No family history on file. Social HX:    History   Social History  . Marital Status: N/A    Spouse Name: N/A  . Number of Children: N/A  . Years of Education: N/A   Occupational History  . Not on file.   Social History Main Topics  . Smoking status: Never Smoker   . Smokeless tobacco: Not on file  . Alcohol Use: Yes  . Drug Use: Not on file  . Sexual Activity: Not on file   Other Topics Concern  . Not on file   Social History Narrative  . No narrative on file     ROS:  All 11 ROS were addressed and are negative except what is stated in the HPI  PHYSICAL EXAM Filed Vitals:   08/04/14 0300  BP: 119/74  Pulse: 71  Temp:   Resp:    General: Well developed, well nourished, in no acute distress Head: Eyes PERRLA, No xanthomas.   Normal cephalic and atramatic  Lungs:  Clear bilaterally to auscultation and percussion. Heart: HRRR S1 S2 Pulses are 2+ & equal.            No carotid bruit. No JVD.  No abdominal bruits. No  femoral bruits. Abdomen: Bowel sounds are positive, abdomen soft and non-tender without masses Extremities:   No clubbing, cyanosis or edema.  DP +1 Neuro: Alert and oriented X 3. Psych:  Good affect, responds appropriately   Labs:   Lab Results  Component Value Date   WBC 12.3* 08/04/2014   HGB 17.1* 08/04/2014   HCT 48.0 08/04/2014   MCV 86.0 08/04/2014   PLT 225 08/04/2014   No results for input(s): NA, K, CL, CO2, BUN, CREATININE, CALCIUM, PROT, BILITOT, ALKPHOS, ALT, AST, GLUCOSE in the last 168 hours.  Invalid input(s): LABALBU No results found for: CKTOTAL, CKMB, CKMBINDEX, TROPONINI No results found for: PTT No results found for: INR, PROTIME  No results found for: CHOL No results found for: HDL No results found for: LDLCALC No results found for: TRIG No results found for: CHOLHDL No results found for: LDLDIRECT    Radiology:  No results found.  EKG:  NSR with inferior ST elevation and early transition ? Inferoposterior MI  ASSESSMENT/PLAN: 1.  Acute inferior STEMI with 9/10 CP despite morphine.  His only CRF is tobacco use.  Pain has been going on for 4 hours.  Will admit to  CCU.  Cycle cardiac enzymes.  Start high dose atorvastatin and check FLP and ALT in am.  ASA 81mg  daily.  IV Heparin gtt per pharmacy.  Hold on BB for now since HR borderline low and inferior MI.  Emergent cardiac cath per Dr. Kirke CorinArida.  Cardiac catheterization was discussed with the patient fully including risks on myocardial infarction, death, stroke, bleeding, arrhythmia, dye allergy, renal insufficiency or bleeding.  All patient questions and concerns were discussed and the patient understands and is willing to proceed.    2.  Tobacco use - smoking cessation counseling by nursing    Quintella ReichertURNER,TRACI R, MD  08/04/2014  3:17 AM

## 2014-08-04 NOTE — Progress Notes (Signed)
Emergent cath this morning demonstrated normal coronaries - source of pain is non-cardiac. Medicine service has been consulted - if it is felt that further inpatient work-up is necessary, I would appreciate it if they would assume care of the patient today.  Chrystie NoseKenneth C. Venita Seng, MD, [facility]ACC Attending Cardiologist [facility]

## 2014-08-05 ENCOUNTER — Ambulatory Visit (HOSPITAL_COMMUNITY): Admission: RE | Admit: 2014-08-05 | Payer: MEDICAID | Source: Ambulatory Visit | Admitting: Cardiovascular Disease

## 2014-08-05 LAB — HEMOGLOBIN A1C
Hgb A1c MFr Bld: 5.4 % (ref 4.8–5.6)
Mean Plasma Glucose: 108 mg/dL

## 2014-08-05 MED ORDER — SODIUM CHLORIDE 0.9 % IJ SOLN
INTRAMUSCULAR | Status: DC | PRN
  Administered 2014-08-04: 04:00:00 via INTRA_ARTERIAL

## 2014-08-05 MED ORDER — IOHEXOL 350 MG/ML SOLN
INTRAVENOUS | Status: DC | PRN
  Administered 2014-08-04: 125 mL via INTRAVENOUS

## 2014-08-05 MED ORDER — FENTANYL CITRATE (PF) 100 MCG/2ML IJ SOLN
INTRAMUSCULAR | Status: DC | PRN
  Administered 2014-08-04: 50 ug
  Administered 2014-08-04: 25 ug via INTRAVENOUS

## 2014-08-05 MED ORDER — HEPARIN SODIUM (PORCINE) 1000 UNIT/ML IJ SOLN
INTRAMUSCULAR | Status: DC | PRN
  Administered 2014-08-04: 2000 [IU] via INTRAVENOUS

## 2014-08-05 MED ORDER — MIDAZOLAM HCL 2 MG/2ML IJ SOLN
INTRAMUSCULAR | Status: DC | PRN
  Administered 2014-08-04: 1 mg via INTRAVENOUS

## 2014-08-06 ENCOUNTER — Encounter (HOSPITAL_COMMUNITY): Payer: Self-pay | Admitting: Cardiovascular Disease

## 2014-08-12 ENCOUNTER — Inpatient Hospital Stay: Payer: Self-pay | Admitting: Family Medicine

## 2014-08-13 ENCOUNTER — Ambulatory Visit: Payer: Self-pay | Attending: Family Medicine | Admitting: Family Medicine

## 2014-08-13 ENCOUNTER — Encounter: Payer: Self-pay | Admitting: Family Medicine

## 2014-08-13 VITALS — BP 107/71 | HR 74 | Temp 97.4°F | Resp 18 | Ht 66.0 in | Wt 196.0 lb

## 2014-08-13 DIAGNOSIS — K760 Fatty (change of) liver, not elsewhere classified: Secondary | ICD-10-CM | POA: Insufficient documentation

## 2014-08-13 DIAGNOSIS — F17200 Nicotine dependence, unspecified, uncomplicated: Secondary | ICD-10-CM

## 2014-08-13 DIAGNOSIS — R9431 Abnormal electrocardiogram [ECG] [EKG]: Secondary | ICD-10-CM | POA: Insufficient documentation

## 2014-08-13 DIAGNOSIS — M25531 Pain in right wrist: Secondary | ICD-10-CM | POA: Insufficient documentation

## 2014-08-13 DIAGNOSIS — K8020 Calculus of gallbladder without cholecystitis without obstruction: Secondary | ICD-10-CM | POA: Insufficient documentation

## 2014-08-13 DIAGNOSIS — F17210 Nicotine dependence, cigarettes, uncomplicated: Secondary | ICD-10-CM | POA: Insufficient documentation

## 2014-08-13 DIAGNOSIS — Z720 Tobacco use: Secondary | ICD-10-CM

## 2014-08-13 MED ORDER — PANTOPRAZOLE SODIUM 20 MG PO TBEC: 20.0000 mg | DELAYED_RELEASE_TABLET | Freq: Every day | ORAL | Status: AC

## 2014-08-13 MED ORDER — TRAMADOL HCL 50 MG PO TABS: 50.0000 mg | ORAL_TABLET | Freq: Three times a day (TID) | ORAL | Status: AC | PRN

## 2014-08-13 MED FILL — Heparin Sodium (Porcine) 2 Unit/ML in Sodium Chloride 0.9%: INTRAMUSCULAR | Qty: 1000 | Status: AC

## 2014-08-13 MED FILL — Lidocaine HCl Local Preservative Free (PF) Inj 1%: INTRAMUSCULAR | Qty: 30 | Status: AC

## 2014-08-13 NOTE — Patient Instructions (Signed)
Colelitiasis (Cholelithiasis) La colelitiasis (tambin llamada clculos en la vescula) es una enfermedad en la que se forman piedras en la vescula. La vescula es un rgano que almacena la bilis que se forma en el hgado y que ayuda a digerir grasas. Los clculos comienzan como pequeos cristales y lentamente se transforman en piedras. El dolor en la vescula ocurre cuando se producen espasmos y los clculos obstruyen el conducto. El dolor tambin se produce cuando una piedra sale por el conducto.  FACTORES DE RIESGO  Ser mujer.   Tener embarazos mltiples. Algunas veces los mdicos aconsejan extirpar los clculos biliares antes de futuros embarazos.   Ser obeso.  Dietas que incluyan comidas fritas y grasas.   Ser mayor de 60 aos y el aumento de la edad.   El uso prolongado de medicamentos que contengan hormonas femeninas.   Tener diabetes mellitus.   Prdida rpida de peso.   Historia familiar de clculos (herencia).  SNTOMAS  Nuseas.   Vmitos.  Dolor abdominal.   Piel amarilla (ictericia)   Dolor sbito. Puede persistir desde algunos minutos hasta algunas horas.  Fiebre.   Sensibilidad al tacto. En algunos casos, cuando los clculos biliares no se mueven hacia el conducto biliar, las personas no sienten dolor ni presentan sntomas. Estos se denominan clculos "silenciosos".  TRATAMIENTO Los clculos silenciosos no requieren tratamiento. En los casos graves, podr ser necesaria una ciruga de urgencia. Las opciones de tratamiento son:  Ciruga para extirpar la vescula. Es el tratamiento ms frecuente.  Medicamentos. No siempre dan resultado y pueden demorar entre 6 y 12 meses o ms en hacer efecto.  Tratamiento con ondas de choque (litotricia biliar extracorporal). En este tratamiento, una mquina de ultrasonido enva ondas de choque a la vescula para destruir los clculos en pequeos fragmentos que luego podrn pasar a los intestinos o ser disueltas  con medicamentos. INSTRUCCIONES PARA EL CUIDADO EN EL HOGAR   Slo tome medicamentos de venta libre o recetados para calmar el dolor, el malestar o bajar la fiebre, segn las indicaciones de su mdico.   Siga una dieta baja en grasas hasta que su mdico lo vea nuevamente. Las grasas hacen que la vescula se contraiga, lo que puede producir dolor.   Concurra a las consultas de control con su mdico segn las indicaciones. Los ataques casi siempre son recurrentes y generalmente habr que someterse a una ciruga como tratamiento permanente.  SOLICITE ATENCIN MDICA DE INMEDIATO SI:   El dolor aumenta y no puede controlarlo con los medicamentos.   Tiene fiebre o sntomas persistentes durante ms de 2 - 3 das.   Tiene fiebre y los sntomas empeoran repentinamente.   Tiene nuseas o vmitos persistentes.  ASEGRESE DE QUE:   Comprende estas instrucciones.  Controlar su afeccin.  Recibir ayuda de inmediato si no mejora o si empeora. Document Released: 01/06/2006 Document Revised: 11/22/2012 Exit[employer] Patient Information 2015 [employer]. This information is not intended to replace advice given to you by your health care provider. Make sure you discuss any questions you have with your health care provider.  

## 2014-08-13 NOTE — Progress Notes (Signed)
Subjective:    Patient ID: John Vasquez, male    DOB: Feb 17, 1981, 34 y.o.   MRN: [id number]  HPI   [age] year old male patient who presented to the ED with epigastric pain and diaphoresis and on presentation was found to have ST elevation in inferior leads and a code STEMI was activated and he was transported to the cardiac cath Lab.  Cardiac cath revealed normal coronaries. His labs negative and he did have an abdominal ultrasound which revealed cholelithiasis without cholecystitis and hepatic steatosis. He was advised on lifestyle modifications and discharged subsequently.   Interval history: He denies abdominal pain, nausea or vomiting. He complains of right wrist pain at the site of his cardiac cath for which she takes Tylenol which only takes the edge off; his right arm also feels numb intermittently. He is also requesting a note for work.  Past Medical History  Diagnosis Date  . Abnormal ECG     a. 07/2014 inferior ST elevation->cath: nl cors, EF 60%.  . Cholelithiasis   . Morbid obesity   . Tobacco abuse     Past Surgical History  Procedure Laterality Date  . Left heart catheterization with coronary angiogram N/A 08/04/2014    Procedure: LEFT HEART CATHETERIZATION WITH CORONARY ANGIOGRAM;  Surgeon: Iran OuchMuhammad A Arida, MD;  Location: [facility] CATH LAB;  Service: Cardiovascular;  Laterality: N/A;  . Cardiac catheterization N/A 08/04/2014    Procedure: Left Heart Cath and Coronary Angiography;  Surgeon: Iran OuchMuhammad A Arida, MD;  Location: [facility] INVASIVE CV LAB CUPID;  Service: Cardiovascular;  Laterality: N/A;    History reviewed. No pertinent family history.  History   Social History  . Marital Status: Married    Spouse Name: N/A  . Number of Children: N/A  . Years of Education: N/A   Occupational History  . Not on file.   Social History Main Topics  . Smoking status: Current Every Day Smoker  . Smokeless tobacco: Not on file  . Alcohol Use: Yes     Comment: occassional   . Drug  Use: No  . Sexual Activity: Not on file     Comment: smokes 2 cigarettes per day   Other Topics Concern  . Not on file   Social History Narrative    No Known Allergies   Review of Systems  Constitutional: Negative for activity change and appetite change.  HENT: Negative for sinus pressure and sore throat.   Eyes: Negative for visual disturbance.  Respiratory: Negative for chest tightness and shortness of breath.   Cardiovascular: Negative for chest pain and palpitations.  Gastrointestinal: Negative for abdominal pain and abdominal distention.  Endocrine: Negative for cold intolerance, heat intolerance and polyphagia.  Genitourinary: Negative for dysuria, frequency and difficulty urinating.  Musculoskeletal: Negative for back pain, joint swelling and arthralgias.       See HPI  Skin: Negative for color change.  Neurological: Negative for dizziness, tremors and weakness.  Psychiatric/Behavioral: Negative for suicidal ideas and behavioral problems.         Objective: Filed Vitals:   08/13/14 1526  BP: 107/71  Pulse: 74  Temp: 97.4 F (36.3 C)  Resp: 18      Physical Exam  Constitutional: He is oriented to person, place, and time. He appears well-developed and well-nourished.  HENT:  Head: Normocephalic and atraumatic.  Right Ear: External ear normal.  Left Ear: External ear normal.  Eyes: Conjunctivae and EOM are normal. Pupils are equal, round, and reactive to light.  Neck: Normal range  of motion. Neck supple. No tracheal deviation present.  Cardiovascular: Normal rate, regular rhythm and normal heart sounds.   No murmur heard. Pulmonary/Chest: Effort normal and breath sounds normal. No respiratory distress. He has no wheezes. He exhibits no tenderness.  Abdominal: Soft. Bowel sounds are normal. He exhibits no mass. There is no tenderness.  Musculoskeletal: Normal range of motion. He exhibits tenderness. He exhibits no edema.  R wrist tenderness at site of cath.    Neurological: He is alert and oriented to person, place, and time.  Skin: Skin is warm and dry.  Psychiatric: He has a normal mood and affect.     EXAM: ULTRASOUND ABDOMEN COMPLETE  COMPARISON: None.  FINDINGS: Gallbladder: Within the gallbladder, there are echogenic foci which move and shadow consistent with gallstones. Largest gallstone measures 1.9 cm in length. There is a 1.2 cm gallstone which remains in the neck of the gallbladder. The gallbladder wall does not appear appreciably thickened, and there is no pericholecystic fluid. No sonographic Murphy sign noted.  Common bile duct: Diameter: 4 mm. There is no intrahepatic, common hepatic, or common bile duct dilatation.  Liver: No focal lesion identified. Liver echogenicity is diffusely increased.  IVC: No abnormality visualized.  Pancreas: Visualized portion unremarkable. Much of the pancreas is obscured by gas.  Spleen: Size and appearance within normal limits.  Right Kidney: Length: 11.9 cm. Echogenicity within normal limits. No mass or hydronephrosis visualized.  Left Kidney: Length: 12.1 cm. Echogenicity within normal limits. No mass or hydronephrosis visualized.  Abdominal aorta: No aneurysm visualized.  Other findings: No demonstrable ascites.  IMPRESSION: Cholelithiasis. There is a gallstone which remains in the neck of the gallbladder.  Diffuse increased liver echogenicity, a finding most likely due to hepatic steatosis. While no focal liver lesions are identified, it must be cautioned that the sensitivity of ultrasound for focal liver lesions is diminished in this circumstance.  Much of the pancreas is obscured by gas.  Study otherwise unremarkable.   Electronically Signed  By: Bretta BangWilliam Woodruff III M.D.  On: 08/04/2014 10:01       Assessment & Plan:  34 year old male patient with no previous past medical history status post cardiac cath secondary to abnormal EKG with  finding of normal coronaries.  Abnormal EKG status post cardiac cath: Normal cardiac cath. Advised on risk factor modification-weight loss, smoking cessation, increasing physical activity. Shocked him tramadol for right wrist pain. Cleared to return to work with limitations on lifting up of 10 pounds 1 week.  Tobacco use disorder: Smoking cessation support: smoking cessation hotline: 1-800-QUIT-NOW.  Smoking cessation classes are available through [facility]. Call 737-458-73824016719538 or visit our website at [email or web address].  Spent 3 minutes counseling on smoking cessation and patient is not ready to quit.  Gallstone: No acute flare at this time. Patientif the clinic or call in the event that pain recurs. Meanwhile I'm placing him on pantoprazole.  Disclaimer: This note was dictated with voice recognition software. Similar sounding words can inadvertently be transcribed and this note may contain transcription errors which may not have been corrected upon publication of note.

## 2014-08-13 NOTE — Progress Notes (Signed)
2 weeks ago patient had strong stomach pain, went to ED. ED told patient he possibly could have heart attack. ED ran tests, resulted in no heart attack. Patients right wrist hurts from catheterization, at level 4, described as throbbing. Tylenol helps wrist pain.

## 2014-09-09 ENCOUNTER — Ambulatory Visit: Payer: Self-pay | Admitting: Internal Medicine

## 2016-12-24 IMAGING — US US ABDOMEN COMPLETE
1 series · 13 of 25 positions shown · non-contrast
Comparison: None.

CLINICAL DATA: Abdominal pain

EXAM:
ULTRASOUND ABDOMEN COMPLETE

[Series 1: us abdomen complete · 0.24mm/px · 13 of 86 slices shown]
[im 1/86]
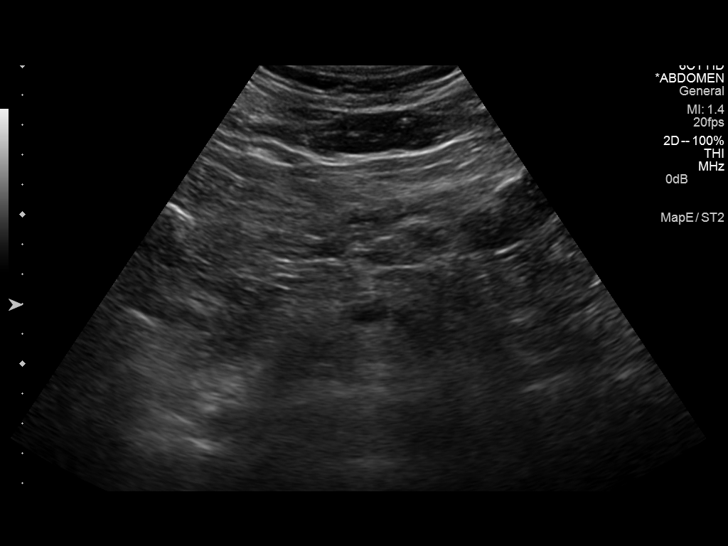
[im 8/86]
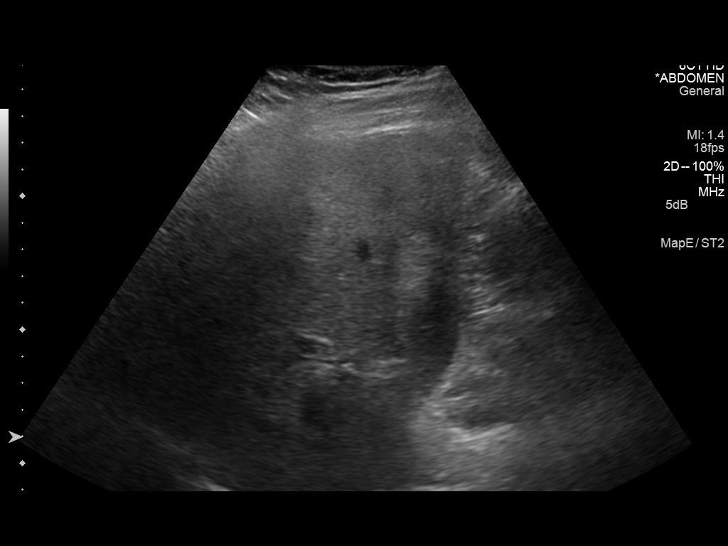
[im 15/86]
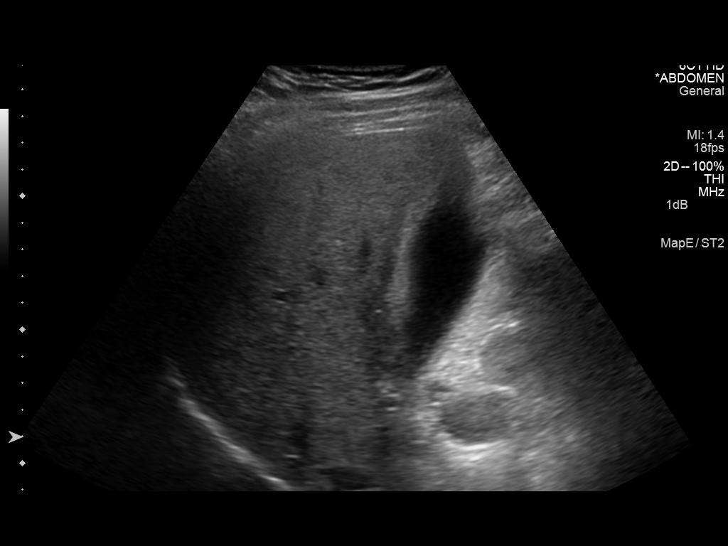
[im 22/86]
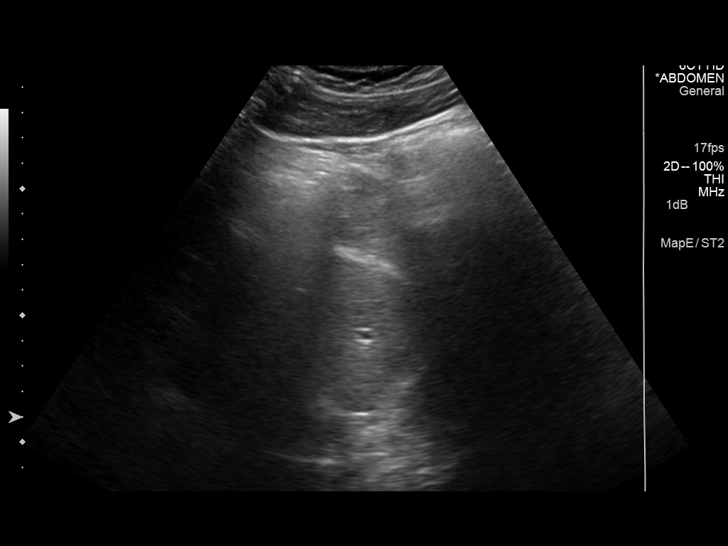
[im 29/86]
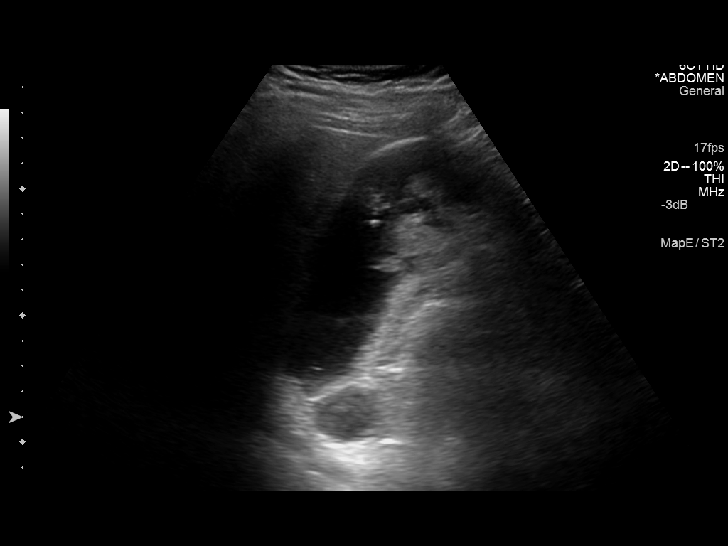
[im 36/86]
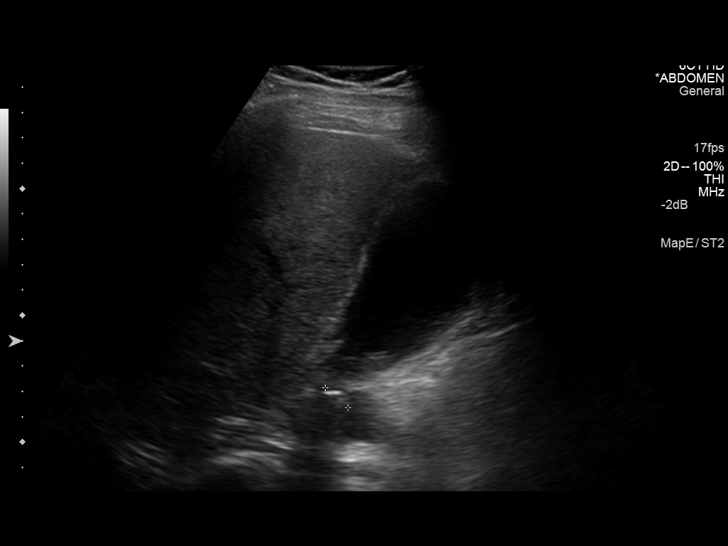
[im 43/86]
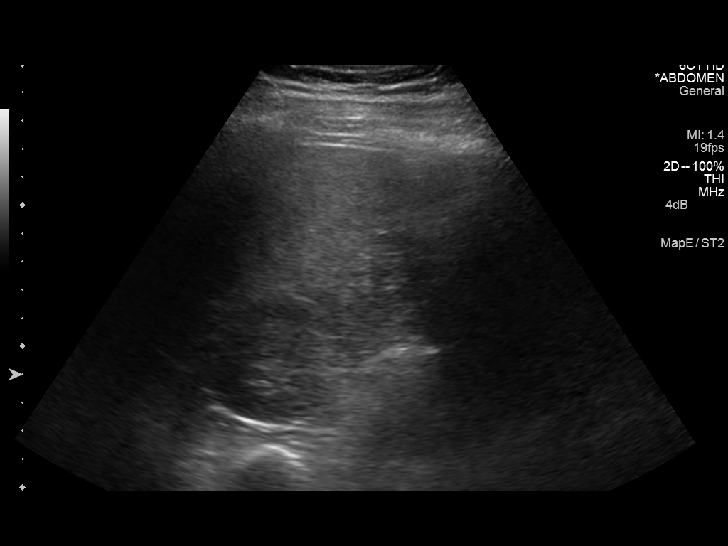
[im 50/86]
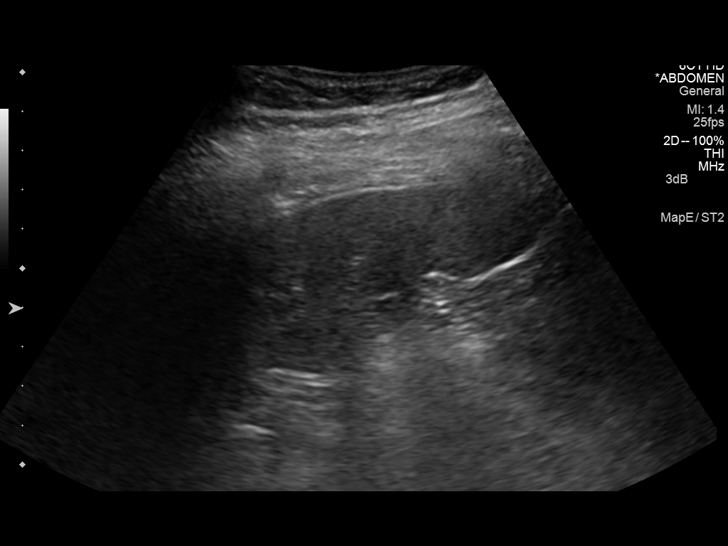
[im 57/86]
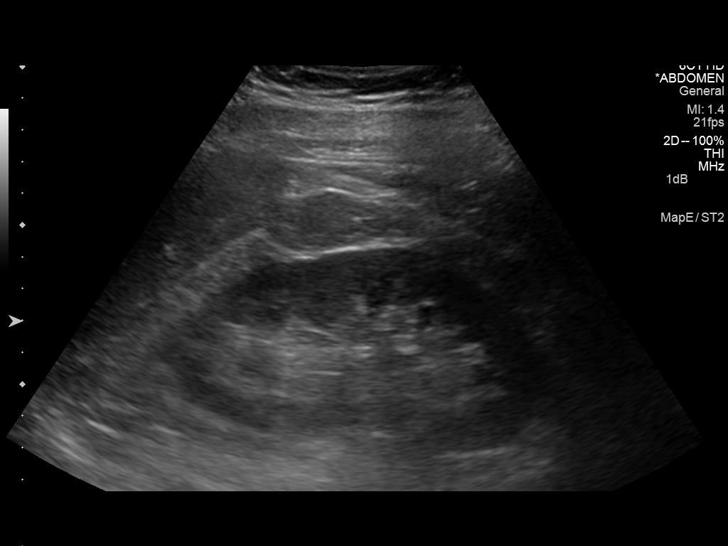
[im 64/86]
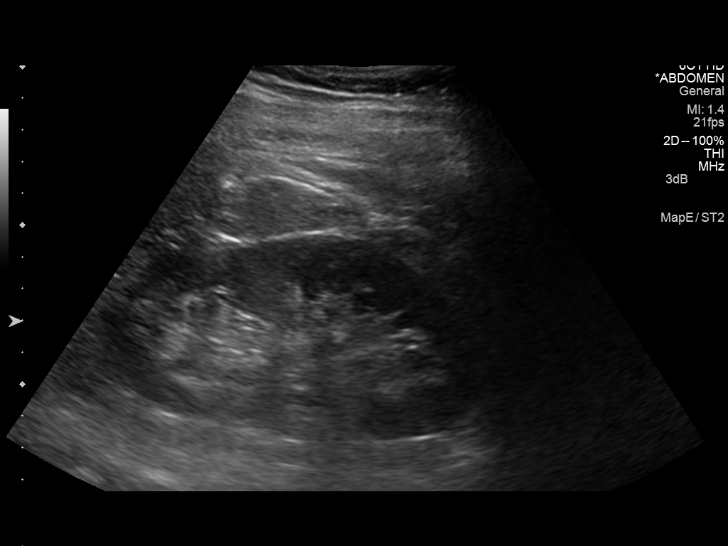
[im 71/86]
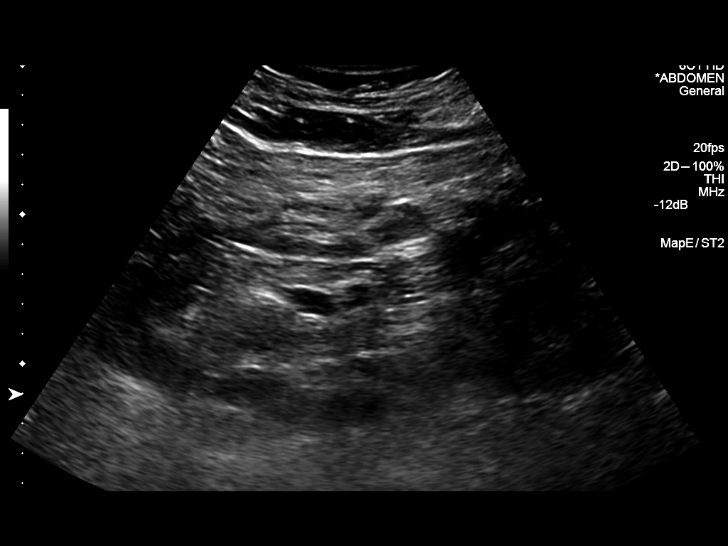
[im 78/86]
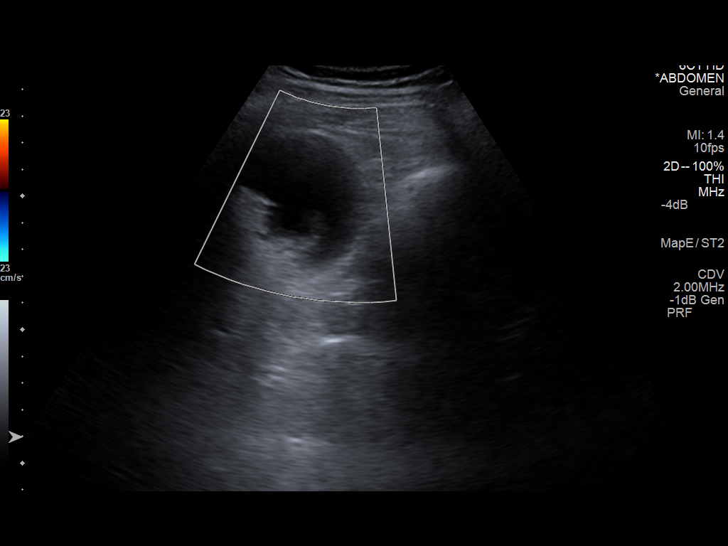
[im 86/86]
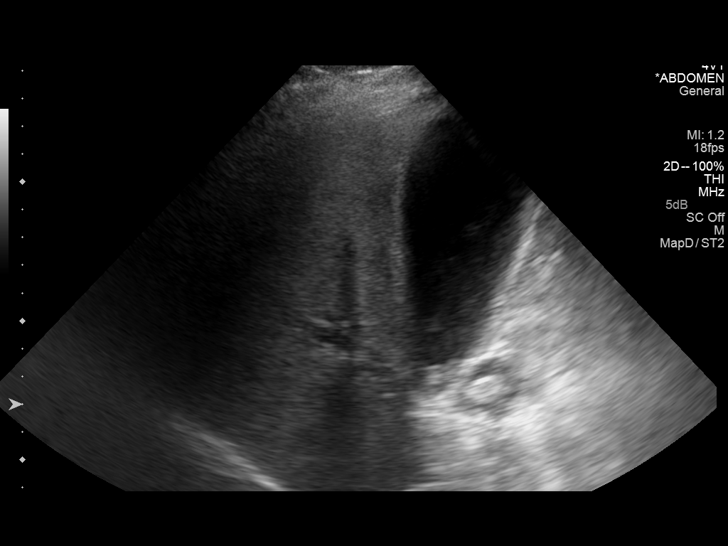

[13 of 25 positions shown; findings below may reference images not displayed]

FINDINGS: Gallbladder: Within the gallbladder, there are echogenic foci which
move and shadow consistent with gallstones. Largest gallstone
measures 1.9 cm in length. There is a 1.2 cm gallstone which remains
in the neck of the gallbladder. The gallbladder wall does not appear
appreciably thickened, and there is no pericholecystic fluid. No
sonographic Murphy sign noted.

Common bile duct: Diameter: 4 mm. There is no intrahepatic, common
hepatic, or common bile duct dilatation.

Liver: No focal lesion identified. Liver echogenicity is diffusely
increased.

IVC: No abnormality visualized.

Pancreas: Visualized portion unremarkable. Much of the pancreas is
obscured by gas.

Spleen: Size and appearance within normal limits.

Right Kidney: Length: 11.9 cm. Echogenicity within normal limits. No
mass or hydronephrosis visualized.

Left Kidney: Length: 12.1 cm. Echogenicity within normal limits. No
mass or hydronephrosis visualized.

Abdominal aorta: No aneurysm visualized.

Other findings: No demonstrable ascites.
IMPRESSION: Cholelithiasis. There is a gallstone which remains in the neck of
the gallbladder.

Diffuse increased liver echogenicity, a finding most likely due to
hepatic steatosis. While no focal liver lesions are identified, it
must be cautioned that the sensitivity of ultrasound for focal liver
lesions is diminished in this circumstance.

Much of the pancreas is obscured by gas.

Study otherwise unremarkable.
